# Patient Record
Sex: Female | Born: 1970
Health system: Southern US, Community
[De-identification: ages and names within clinical notes are randomized; demographics above are authoritative.]

## PROBLEM LIST (undated history)

## (undated) DIAGNOSIS — F329 Major depressive disorder, single episode, unspecified: Secondary | ICD-10-CM

## (undated) DIAGNOSIS — C55 Malignant neoplasm of uterus, part unspecified: Secondary | ICD-10-CM

## (undated) DIAGNOSIS — E039 Hypothyroidism, unspecified: Secondary | ICD-10-CM

## (undated) DIAGNOSIS — T7840XA Allergy, unspecified, initial encounter: Secondary | ICD-10-CM

## (undated) DIAGNOSIS — R51 Headache: Secondary | ICD-10-CM

## (undated) DIAGNOSIS — F32A Depression, unspecified: Secondary | ICD-10-CM

## (undated) DIAGNOSIS — R519 Headache, unspecified: Secondary | ICD-10-CM

## (undated) DIAGNOSIS — M1990 Unspecified osteoarthritis, unspecified site: Secondary | ICD-10-CM

## (undated) HISTORY — DX: Allergy, unspecified, initial encounter: T78.40XA

## (undated) HISTORY — DX: Hypothyroidism, unspecified: E03.9

## (undated) HISTORY — DX: Malignant neoplasm of uterus, part unspecified: C55

## (undated) HISTORY — DX: Headache: R51

## (undated) HISTORY — PX: TUBAL LIGATION: SHX77

## (undated) HISTORY — DX: Headache, unspecified: R51.9

## (undated) HISTORY — DX: Depression, unspecified: F32.A

## (undated) HISTORY — DX: Unspecified osteoarthritis, unspecified site: M19.90

## (undated) HISTORY — DX: Major depressive disorder, single episode, unspecified: F32.9

---

## 2012-12-17 DIAGNOSIS — C55 Malignant neoplasm of uterus, part unspecified: Secondary | ICD-10-CM

## 2012-12-17 HISTORY — DX: Malignant neoplasm of uterus, part unspecified: C55

## 2014-12-17 HISTORY — PX: ABDOMINAL HYSTERECTOMY: SHX81

## 2016-09-06 ENCOUNTER — Ambulatory Visit (INDEPENDENT_AMBULATORY_CARE_PROVIDER_SITE_OTHER): Payer: Managed Care, Other (non HMO) | Admitting: Primary Care

## 2016-09-06 ENCOUNTER — Encounter: Payer: Self-pay | Admitting: Primary Care

## 2016-09-06 VITALS — BP 120/78 | HR 86 | Temp 98.3°F | Ht 65.0 in | Wt 164.8 lb

## 2016-09-06 DIAGNOSIS — R51 Headache: Secondary | ICD-10-CM | POA: Diagnosis not present

## 2016-09-06 DIAGNOSIS — E039 Hypothyroidism, unspecified: Secondary | ICD-10-CM | POA: Insufficient documentation

## 2016-09-06 DIAGNOSIS — F419 Anxiety disorder, unspecified: Secondary | ICD-10-CM

## 2016-09-06 DIAGNOSIS — F329 Major depressive disorder, single episode, unspecified: Secondary | ICD-10-CM

## 2016-09-06 DIAGNOSIS — F418 Other specified anxiety disorders: Secondary | ICD-10-CM | POA: Diagnosis not present

## 2016-09-06 DIAGNOSIS — R519 Headache, unspecified: Secondary | ICD-10-CM

## 2016-09-06 LAB — TSH: TSH: 12.24 u[IU]/mL — AB (ref 0.35–4.50)

## 2016-09-06 MED ORDER — BUPROPION HCL ER (XL) 150 MG PO TB24: 150.0000 mg | ORAL_TABLET | Freq: Every day | ORAL | 3 refills | Status: DC

## 2016-09-06 MED ORDER — HYDROXYZINE HCL 25 MG PO TABS: 25.0000 mg | ORAL_TABLET | Freq: Four times a day (QID) | ORAL | 1 refills | Status: DC | PRN

## 2016-09-06 MED ORDER — SERTRALINE HCL 100 MG PO TABS: 100.0000 mg | ORAL_TABLET | Freq: Every day | ORAL | 3 refills | Status: DC

## 2016-09-06 MED ORDER — LEVOTHYROXINE SODIUM 125 MCG PO TABS: 125.0000 ug | ORAL_TABLET | Freq: Every day | ORAL | 0 refills | Status: DC

## 2016-09-06 NOTE — Patient Instructions (Signed)
Complete lab work prior to leaving today. I will notify you of your results once received.   I sent refills of Wellbutrin XL, Zoloft, Hydroxyzine, and levothyroxine to [facility] as requested.  Please schedule a follow up appointment in 6 weeks for re-evaluation of your symptoms.  It was a pleasure to meet you today! Please don't hesitate to call me with any questions. Welcome to [facility]!

## 2016-09-06 NOTE — Progress Notes (Signed)
Subjective:    Patient ID: Theresa Valencia, female    DOB: 01-Apr-1971, 45 y.o.   MRN: [id number]  HPI  Theresa Valencia is a 45 year old female who presents today to establish care and discuss the problems mentioned below. Will obtain old records.  1) Generalized Anxiety Disorder and Depression: Diagnosed 3 years ago. Currently managed on Zoloft 100 mg, Bupropion XL 150 mg, hydroxyzine, and Clonazepam 1 mg.   She moved to the area several months ago and has been rationing her medication as she was nearly out.  Since her move several months ago she's experiening daily panic attacks (2-3 per day), experiencing difficulty at work where she cannot deal with loud noises, has difficulty communicating with her co-workers. She's been crying numerous times daily as she is frustrated and sad since moving. She is completely out of her levothyroxine and Zoloft.   2) Hypothyroidism: Currently managed on levothyroxine 125 mg. She has not been taking her medications as prescribed as she's been rationing her medications since she moved from [place]. She's been out of her thyroid medication for 2 weeks now. Her last TSH above goal 3-4 months ago which was increased to 125 mcg from 100 mcg. She was supposed to follow up with her PCP in [place], but could not get there before she moved. She's experienced mood swings with fatigue.  3) Frequent Headaches: Long history of headaches to right frontal lobe with radiation to right temporal region. Migraines occur every 4-5 months, she will experience photophobia and nausea. She takes tylenol with some improvement. Her last eye exam was 5 years ago.   Review of Systems  Constitutional: Positive for fatigue.  Respiratory: Negative for shortness of breath.   Cardiovascular: Negative for chest pain.  Neurological: Positive for headaches. Negative for dizziness.  Psychiatric/Behavioral: Positive for sleep disturbance. Negative for suicidal ideas. The patient is nervous/anxious.     Depression, tearful, irritable.        Past Medical History:  Diagnosis Date  . Allergy   . Arthritis   . Depression   . Frequent headaches   . Hypothyroidism   . Uterine cancer ([place]) 2014     Social History   Social History  . Marital status: Married    Spouse name: N/A  . Number of children: N/A  . Years of education: N/A   Occupational History  . Not on file.   Social History Main Topics  . Smoking status: Current Every Day Smoker    Packs/day: 0.50    Years: 30.00    Types: Cigarettes  . Smokeless tobacco: Never Used  . Alcohol use No  . Drug use: Unknown  . Sexual activity: Not on file   Other Topics Concern  . Not on file   Social History Narrative   Married.   6 children. 2 grandchildren.   Works for [employer].    Enjoys reading.     Past Surgical History:  Procedure Laterality Date  . ABDOMINAL HYSTERECTOMY  2016  . TUBAL LIGATION      Family History  Problem Relation Age of Onset  . Colon cancer Maternal Grandfather   . Prostate cancer Maternal Grandfather   . Hypertension Maternal Grandfather   . Diabetes Maternal Grandfather     No Known Allergies  No current outpatient prescriptions on file prior to visit.   No current facility-administered medications on file prior to visit.     BP 120/78   Pulse 86   Temp 98.3 F (  36.8 C) (Oral)   Ht 5\' 5"  (1.651 m)   Wt 164 lb 12.8 oz (74.8 kg)   SpO2 97%   BMI 27.42 kg/m    Objective:   Physical Exam  Constitutional: She is oriented to person, place, and time. She appears well-nourished.  Neck: Neck supple. No thyromegaly present.  Cardiovascular: Normal rate and regular rhythm.   Pulmonary/Chest: Effort normal and breath sounds normal.  Neurological: She is alert and oriented to person, place, and time.  Skin: Skin is warm and dry.  Psychiatric:  Tearful during exam intermittently          Assessment & Plan:

## 2016-09-06 NOTE — Progress Notes (Signed)
Pre visit review using our clinic review tool, if applicable. No additional management support is needed unless otherwise documented below in the visit note. 

## 2016-09-06 NOTE — Assessment & Plan Note (Signed)
Daily, mostly right frontal and temporal. Migraines every several months. Continue tylenol for now.

## 2016-09-06 NOTE — Assessment & Plan Note (Signed)
Managed on levothyroxine 125 mcg, recent increase several months ago due to elevations in TSH. Suspect mood swings, depression, fatigue, panice attacks could be related to lack of levothyroxine as she is out. Check TSH today. Restart levothyroxine at 125 mcg. Follow up in 6 weeks.

## 2016-09-06 NOTE — Assessment & Plan Note (Signed)
Out of meds now, has been rationing them by taking less than prescribed. Suspect symptoms today related to a combination of lack of levothyroxine and gradual decrease in prescribed antidepressants. Discussed that I do not prescribe Clonazepam. She understands. Refill provided for Wellbutrin XL, Zoloft, and hydroxyzine. Follow up in 6 weeks for re-evaluation.

## 2016-10-22 ENCOUNTER — Ambulatory Visit (INDEPENDENT_AMBULATORY_CARE_PROVIDER_SITE_OTHER): Payer: Managed Care, Other (non HMO) | Admitting: Primary Care

## 2016-10-22 ENCOUNTER — Encounter: Payer: Self-pay | Admitting: Primary Care

## 2016-10-22 VITALS — BP 118/78 | HR 77 | Temp 97.9°F | Ht 65.0 in | Wt 168.8 lb

## 2016-10-22 DIAGNOSIS — F419 Anxiety disorder, unspecified: Principal | ICD-10-CM

## 2016-10-22 DIAGNOSIS — T148XXA Other injury of unspecified body region, initial encounter: Secondary | ICD-10-CM

## 2016-10-22 DIAGNOSIS — F418 Other specified anxiety disorders: Secondary | ICD-10-CM | POA: Diagnosis not present

## 2016-10-22 DIAGNOSIS — F329 Major depressive disorder, single episode, unspecified: Secondary | ICD-10-CM

## 2016-10-22 DIAGNOSIS — E039 Hypothyroidism, unspecified: Secondary | ICD-10-CM | POA: Diagnosis not present

## 2016-10-22 LAB — CBC
HEMATOCRIT: 38.8 % (ref 36.0–46.0)
HEMOGLOBIN: 13.5 g/dL (ref 12.0–15.0)
MCHC: 34.8 g/dL (ref 30.0–36.0)
MCV: 92.5 fl (ref 78.0–100.0)
Platelets: 261 10*3/uL (ref 150.0–400.0)
RBC: 4.19 Mil/uL (ref 3.87–5.11)
RDW: 13.1 % (ref 11.5–15.5)
WBC: 5.6 10*3/uL (ref 4.0–10.5)

## 2016-10-22 LAB — TSH: TSH: 17.79 u[IU]/mL — ABNORMAL HIGH (ref 0.35–4.50)

## 2016-10-22 NOTE — Progress Notes (Signed)
Subjective:    Patient ID: Theresa Valencia, female    DOB: February 05, 1971, 45 y.o.   MRN: [id number]  HPI  Ms. Theresa Valencia is a 45 year old female who presents today for follow up of anxiety and depression since moving to [place] from [place]. She was evaluated as a new patient 6 weeks ago with complaints of increased symptoms as she had run out of her prior medications for Wellbutrin, Zoloft, and Clonazepam. Last visit she was re-initiated on Zoloft, Wellbutrin. We switched Clonazepam for hydroxyzine.   Since her last visit her mother has passed away and she has recently received a promotion at work. She's feeling much worse overall as she feels like she cannot breathe since the passing of her mother. She wants to shut herself off from the world. She feels that her promotion has caused increased stress as she has more responsibility. She denies SI/HI. She is sleeping well.  2) Hypothyroidism: TSH of 12 during last visit as she's had previously been out of her levothyroxine. She is due for recheck today. She is currently managed on Levothyroxine 125 mcg. She has noticed numerous bruises that have become frequent to her bilateral lower extremities without recollection of recent injury.  Review of Systems  Constitutional: Positive for fatigue.  Respiratory: Positive for shortness of breath.   Cardiovascular: Positive for palpitations. Negative for chest pain.  Psychiatric/Behavioral: Negative for sleep disturbance and suicidal ideas. The patient is nervous/anxious.        See HPI       Past Medical History:  Diagnosis Date  . Allergy   . Arthritis   . Depression   . Frequent headaches   . Hypothyroidism   . Uterine cancer ([place]) 2014     Social History   Social History  . Marital status: Married    Spouse name: N/A  . Number of children: N/A  . Years of education: N/A   Occupational History  . Not on file.   Social History Main Topics  . Smoking status: Current Every Day Smoker   Packs/day: 0.50    Years: 30.00    Types: Cigarettes  . Smokeless tobacco: Never Used  . Alcohol use No  . Drug use: Unknown  . Sexual activity: Not on file   Other Topics Concern  . Not on file   Social History Narrative   Married.   6 children. 2 grandchildren.   Works for [employer].    Enjoys reading.     Past Surgical History:  Procedure Laterality Date  . ABDOMINAL HYSTERECTOMY  2016  . TUBAL LIGATION      Family History  Problem Relation Age of Onset  . Colon cancer Maternal Grandfather   . Prostate cancer Maternal Grandfather   . Hypertension Maternal Grandfather   . Diabetes Maternal Grandfather     No Known Allergies  Current Outpatient Prescriptions on File Prior to Visit  Medication Sig Dispense Refill  . buPROPion (WELLBUTRIN XL) 150 MG 24 hr tablet Take 1 tablet (150 mg total) by mouth daily. 90 tablet 3  . hydrOXYzine (ATARAX/VISTARIL) 25 MG tablet Take 1 tablet (25 mg total) by mouth every 6 (six) hours as needed for anxiety. 90 tablet 1  . levothyroxine (SYNTHROID, LEVOTHROID) 125 MCG tablet Take 1 tablet (125 mcg total) by mouth daily before breakfast. 30 tablet 0  . sertraline (ZOLOFT) 100 MG tablet Take 1 tablet (100 mg total) by mouth daily. 90 tablet 3   No current facility-administered medications on  file prior to visit.     BP 118/78   Pulse 77   Temp 97.9 F (36.6 C) (Oral)   Ht 5\' 5"  (1.651 m)   Wt 168 lb 12.8 oz (76.6 kg)   SpO2 97%   BMI 28.09 kg/m    Objective:   Physical Exam  Constitutional: She appears well-nourished.  Cardiovascular: Normal rate and regular rhythm.   Pulmonary/Chest: Effort normal and breath sounds normal.  Skin: Skin is warm and dry.  Mild bruising to bilateral lower extremities.  Psychiatric:  Tearful during exam          Assessment & Plan:

## 2016-10-22 NOTE — Progress Notes (Signed)
Pre visit review using our clinic review tool, if applicable. No additional management support is needed unless otherwise documented below in the visit note. 

## 2016-10-22 NOTE — Assessment & Plan Note (Signed)
Has lost her mother and received new promotion since last visit. Feels worse. Compliant to medications. Tearful during exam, denies SI/HI. Urgent referral sent for therapy as she would greatly benefit.

## 2016-10-22 NOTE — Patient Instructions (Signed)
Complete lab work prior to leaving today. I will notify you of your results once received.   Stop by the front desk and speak with either Rosaria Ferries or Ebony Hail regarding your referral to therapy.   Congratulations on your promotion! You deserve it!  It was a pleasure to see you today!

## 2016-10-22 NOTE — Assessment & Plan Note (Addendum)
TSH of 12 on previous labs, due for recheck today since re-initiation of levothyroxine 125 mcg. Will either continue current dose or adjust accordingly. Will check CBC for bruising.

## 2016-10-23 ENCOUNTER — Telehealth: Payer: Self-pay | Admitting: Primary Care

## 2016-10-23 ENCOUNTER — Other Ambulatory Visit: Payer: Self-pay | Admitting: Primary Care

## 2016-10-23 DIAGNOSIS — E039 Hypothyroidism, unspecified: Secondary | ICD-10-CM

## 2016-10-23 MED ORDER — LEVOTHYROXINE SODIUM 150 MCG PO TABS: 150.0000 ug | ORAL_TABLET | Freq: Every day | ORAL | 0 refills | Status: DC

## 2016-10-23 NOTE — Telephone Encounter (Signed)
Spoken and notified patient of Kate's comments. Patient verbalized understanding. 

## 2016-10-23 NOTE — Telephone Encounter (Signed)
Patient returned Chan's call.  Patient said she'll clear out her voice mail, so Vallarie Mare can leave a detailed message on her voice mail.

## 2016-11-14 ENCOUNTER — Ambulatory Visit: Payer: Managed Care, Other (non HMO) | Admitting: Psychology

## 2016-11-15 ENCOUNTER — Other Ambulatory Visit: Payer: Self-pay | Admitting: Primary Care

## 2016-11-15 DIAGNOSIS — E039 Hypothyroidism, unspecified: Secondary | ICD-10-CM

## 2016-11-21 ENCOUNTER — Other Ambulatory Visit: Payer: Managed Care, Other (non HMO)

## 2016-11-30 ENCOUNTER — Encounter: Payer: Self-pay | Admitting: Emergency Medicine

## 2016-11-30 ENCOUNTER — Emergency Department: Payer: Managed Care, Other (non HMO)

## 2016-11-30 DIAGNOSIS — E039 Hypothyroidism, unspecified: Secondary | ICD-10-CM | POA: Diagnosis not present

## 2016-11-30 DIAGNOSIS — Z8541 Personal history of malignant neoplasm of cervix uteri: Secondary | ICD-10-CM | POA: Diagnosis not present

## 2016-11-30 DIAGNOSIS — T50995A Adverse effect of other drugs, medicaments and biological substances, initial encounter: Secondary | ICD-10-CM | POA: Insufficient documentation

## 2016-11-30 DIAGNOSIS — Z79899 Other long term (current) drug therapy: Secondary | ICD-10-CM | POA: Insufficient documentation

## 2016-11-30 DIAGNOSIS — Y69 Unspecified misadventure during surgical and medical care: Secondary | ICD-10-CM | POA: Insufficient documentation

## 2016-11-30 DIAGNOSIS — T887XXA Unspecified adverse effect of drug or medicament, initial encounter: Secondary | ICD-10-CM | POA: Diagnosis present

## 2016-11-30 DIAGNOSIS — F17210 Nicotine dependence, cigarettes, uncomplicated: Secondary | ICD-10-CM | POA: Diagnosis not present

## 2016-11-30 MED ORDER — ONDANSETRON 4 MG PO TBDP
ORAL_TABLET | ORAL | Status: AC
  Filled 2016-11-30: qty 1

## 2016-11-30 MED ORDER — ONDANSETRON 4 MG PO TBDP
4.0000 mg | ORAL_TABLET | Freq: Once | ORAL | Status: AC | PRN
  Administered 2016-11-30: 4 mg via ORAL

## 2016-11-30 NOTE — ED Triage Notes (Addendum)
Pt to desk via [place], reports recently increased her synthroid, now having headache, blurred vision, and n/v.  Pt tearful at stat desk

## 2016-12-01 ENCOUNTER — Emergency Department
Admission: EM | Admit: 2016-12-01 | Discharge: 2016-12-01 | Disposition: A | Discharge status: Home / Self Care | Payer: Managed Care, Other (non HMO) | Attending: Emergency Medicine | Admitting: Emergency Medicine

## 2016-12-01 DIAGNOSIS — T50905A Adverse effect of unspecified drugs, medicaments and biological substances, initial encounter: Secondary | ICD-10-CM

## 2016-12-01 MED ORDER — ONDANSETRON 4 MG PO TBDP: 4.0000 mg | ORAL_TABLET | Freq: Three times a day (TID) | ORAL | 0 refills | Status: DC | PRN

## 2016-12-01 NOTE — Discharge Instructions (Signed)
1. Resume your old dosage of Synthroid. You may now take 1/2 tablet daily. 2. You may take nausea medicine as needed (Zofran #20). 3. Return to the ER for worsening symptoms, persistent vomiting, difficulty breathing or other concerns.

## 2016-12-01 NOTE — ED Provider Notes (Signed)
[facility] Emergency Department Provider Note   ____________________________________________   First MD Initiated Contact with Patient 12/01/16 0118     (approximate)  I have reviewed the triage vital signs and the nursing notes.   HISTORY  Chief Complaint Medication Reaction; Headache; and Emesis    HPI Theresa Valencia is a 45 y.o. female who presents to the ED from home with a chief complaint of medication reaction. Patient has a history of hypothyroidism on Synthroid. Reports her PCP recently doubled her dosage. She took her new dosage yesterday afternoon and began having headache with associated nausea and vomiting as well as blurred vision. Patient reports similar symptoms previously with each increase in Synthroid dosage; however, her reaction this time was the most intense and made her tearful. Denies associated neck pain, chest pain, shortness of breath, dysuria, diarrhea. Denies recent travel or trauma. Nothing made her symptoms worse. Patient received an ODT Zofran while awaiting treatment room and all symptoms have resolved.   Past Medical History:  Diagnosis Date  . Allergy   . Arthritis   . Depression   . Frequent headaches   . Hypothyroidism   . Uterine cancer [facility]) 2014    Patient Active Problem List   Diagnosis Date Noted  . Hypothyroidism 09/06/2016  . Anxiety and depression 09/06/2016  . Frequent headaches 09/06/2016    Past Surgical History:  Procedure Laterality Date  . ABDOMINAL HYSTERECTOMY  2016  . TUBAL LIGATION      Prior to Admission medications   Medication Sig Start Date End Date Taking? Authorizing Provider  buPROPion (WELLBUTRIN XL) 150 MG 24 hr tablet Take 1 tablet (150 mg total) by mouth daily. 09/06/16   Pleas Koch, NP  hydrOXYzine (ATARAX/VISTARIL) 25 MG tablet Take 1 tablet (25 mg total) by mouth every 6 (six) hours as needed for anxiety. 09/06/16   Pleas Koch, NP  levothyroxine (SYNTHROID) 150 MCG  tablet Take 1 tablet (150 mcg total) by mouth daily before breakfast. 10/23/16   Pleas Koch, NP  ondansetron (ZOFRAN ODT) 4 MG disintegrating tablet Take 1 tablet (4 mg total) by mouth every 8 (eight) hours as needed for nausea or vomiting. 12/01/16   Paulette Blanch, MD  sertraline (ZOLOFT) 100 MG tablet Take 1 tablet (100 mg total) by mouth daily. 09/06/16   Pleas Koch, NP    Allergies Patient has no known allergies.  Family History  Problem Relation Age of Onset  . Colon cancer Maternal Grandfather   . Prostate cancer Maternal Grandfather   . Hypertension Maternal Grandfather   . Diabetes Maternal Grandfather     Social History Social History  Substance Use Topics  . Smoking status: Current Every Day Smoker    Packs/day: 0.50    Years: 30.00    Types: Cigarettes  . Smokeless tobacco: Never Used  . Alcohol use No    Review of Systems  Constitutional: No fever/chills. Eyes: Positive for blurry vision.. ENT: No sore throat. Cardiovascular: Denies chest pain. Respiratory: Denies shortness of breath. Gastrointestinal: No abdominal pain.  Positive for nausea and vomiting.  No diarrhea.  No constipation. Genitourinary: Negative for dysuria. Musculoskeletal: Negative for back pain. Skin: Negative for rash. Neurological: Positive for headache. Negative for focal weakness or numbness.  10-point ROS otherwise negative.  ____________________________________________   PHYSICAL EXAM:  VITAL SIGNS: ED Triage Vitals [11/30/16 2238]  Enc Vitals Group     BP 123/86     Pulse Rate 89  Resp 20     Temp 98 F (36.7 C)     Temp Source Oral     SpO2 97 %     Weight 160 lb (72.6 kg)     Height 5\' 4"  (1.626 m)     Head Circumference      Peak Flow      Pain Score 7     Pain Loc      Pain Edu?      Excl. in [place]?     Constitutional: Alert and oriented. Well appearing and in no acute distress. Eyes: Conjunctivae are normal. PERRL. EOMI. Head: Atraumatic. Nose:  No congestion/rhinnorhea. Mouth/Throat: Mucous membranes are moist.  Oropharynx non-erythematous. Neck: No stridor.  Supple neck without meningismus. Cardiovascular: Normal rate, regular rhythm. Grossly normal heart sounds.  Good peripheral circulation. Respiratory: Normal respiratory effort.  No retractions. Lungs CTAB. Gastrointestinal: Soft and nontender. No distention. No abdominal bruits. No CVA tenderness. Musculoskeletal: No lower extremity tenderness nor edema.  No joint effusions. Neurologic:  Normal speech and language. No gross focal neurologic deficits are appreciated. No gait instability. Skin:  Skin is warm, dry and intact. No rash noted. Psychiatric: Mood and affect are normal. Speech and behavior are normal.  ____________________________________________   LABS (all labs ordered are listed, but only abnormal results are displayed)  Labs Reviewed - No data to display ____________________________________________  EKG  None ____________________________________________  RADIOLOGY  CT head without contrast interpreted per Dr. Quintella Reichert: No acute intracranial pathology. ____________________________________________   PROCEDURES  Procedure(s) performed: None  Procedures  Critical Care performed: No  ____________________________________________   INITIAL IMPRESSION / ASSESSMENT AND PLAN / ED COURSE  Pertinent labs & imaging results that were available during my care of the patient were reviewed by me and considered in my medical decision making (see chart for details).  46 year old female who presents with medication reaction secondary to doubling Synthroid dosage. She is currently asymptomatic without focal neurological deficits after ODT Zofran. We discussed at length and I recommended she go back to her old dose and see her PCP next week. Will also give her the number to our local endocrinologist for specialty follow-up. Strict return precautions given. Patient  verbalizes understanding and agrees with plan of care.  Clinical Course      ____________________________________________   FINAL CLINICAL IMPRESSION(S) / ED DIAGNOSES  Final diagnoses:  Medication reaction, initial encounter      NEW MEDICATIONS STARTED DURING THIS VISIT:  Discharge Medication List as of 12/01/2016  1:36 AM    START taking these medications   Details  ondansetron (ZOFRAN ODT) 4 MG disintegrating tablet Take 1 tablet (4 mg total) by mouth every 8 (eight) hours as needed for nausea or vomiting., Starting Sat 12/01/2016, Print         Note:  This document was prepared using Dragon voice recognition software and may include unintentional dictation errors.    Paulette Blanch, MD 12/01/16 [phone]

## 2016-12-01 NOTE — ED Notes (Signed)
Pt states she went up on her dose of synthroid. Pt states she started throwing up so much it gave her a headache and blurred vision. Pt states she is feeling much better now, feels HA going away. Pt is alert and oriented x4, moving around on bed. Pt states she wants to go home now. Family raising voice at pt and this RN stating that she needs to take the medicine when pt states she won't take it anymore.

## 2016-12-12 ENCOUNTER — Telehealth: Payer: Self-pay | Admitting: Primary Care

## 2016-12-12 NOTE — Telephone Encounter (Signed)
Please call patient:  I saw that she went to the ED on 12/15 for a reaction after we increased her Synthroid. 1. Please ensure she understands that we did NOT double her medication, we increased her from 125 mcg to 150 mcg. She should be taking only 150 mcg, not anything doubled.  2. How much is she taking now? 3. How's she feeling now?

## 2016-12-12 NOTE — Telephone Encounter (Signed)
Message left for patient to return my call.  

## 2016-12-19 NOTE — Telephone Encounter (Signed)
Spoken and notified patient of Kate's comments.   Patient stated that since the ED visit she has been taking 1/2 tablet of the 150 mcg.  Patient feels bad. She has been very tired, dizzy at times, blurred vision but cannot afford to pay the $20 co-pay to come to the office. 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

## 2016-12-19 NOTE — Telephone Encounter (Signed)
She needs a repeat TSH, can she come in for a lab only appointment for recheck? If so, please schedule.

## 2016-12-19 NOTE — Telephone Encounter (Signed)
Message left for patient to return my call.  

## 2016-12-20 ENCOUNTER — Other Ambulatory Visit (INDEPENDENT_AMBULATORY_CARE_PROVIDER_SITE_OTHER): Payer: Managed Care, Other (non HMO)

## 2016-12-20 DIAGNOSIS — E039 Hypothyroidism, unspecified: Secondary | ICD-10-CM | POA: Diagnosis not present

## 2016-12-20 LAB — TSH: TSH: 1.97 u[IU]/mL (ref 0.35–4.50)

## 2016-12-20 NOTE — Telephone Encounter (Signed)
Spoken to patient and the lab appt on 12/20/2016.  Per patient to leave a detail message of lab results.

## 2017-02-05 ENCOUNTER — Other Ambulatory Visit: Payer: Self-pay | Admitting: Primary Care

## 2017-02-05 DIAGNOSIS — E039 Hypothyroidism, unspecified: Secondary | ICD-10-CM

## 2017-08-13 ENCOUNTER — Ambulatory Visit (INDEPENDENT_AMBULATORY_CARE_PROVIDER_SITE_OTHER): Payer: Managed Care, Other (non HMO) | Admitting: Primary Care

## 2017-08-13 ENCOUNTER — Encounter: Payer: Self-pay | Admitting: Primary Care

## 2017-08-13 VITALS — BP 110/76 | HR 78 | Temp 98.0°F | Ht 65.0 in | Wt 147.0 lb

## 2017-08-13 DIAGNOSIS — M545 Low back pain, unspecified: Secondary | ICD-10-CM | POA: Insufficient documentation

## 2017-08-13 DIAGNOSIS — F419 Anxiety disorder, unspecified: Secondary | ICD-10-CM | POA: Diagnosis not present

## 2017-08-13 DIAGNOSIS — F329 Major depressive disorder, single episode, unspecified: Secondary | ICD-10-CM | POA: Diagnosis not present

## 2017-08-13 DIAGNOSIS — E039 Hypothyroidism, unspecified: Secondary | ICD-10-CM | POA: Diagnosis not present

## 2017-08-13 DIAGNOSIS — G8929 Other chronic pain: Secondary | ICD-10-CM | POA: Insufficient documentation
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
## 2017-08-13 NOTE — Assessment & Plan Note (Signed)
Improved since switching jobs and since her mother as passed. Will continue to monitor off medications.

## 2017-08-13 NOTE — Patient Instructions (Signed)
Schedule a lab and xray only appointment later this week at your convenience.  I will be in touch with your results once received. I will then notify you of the best treatment for pain at that time.  It was a pleasure to see you today!

## 2017-08-13 NOTE — Assessment & Plan Note (Addendum)
Check TSH today given history of unstable readings. Continue levothyroxine 150 mcg for now.

## 2017-08-15 ENCOUNTER — Other Ambulatory Visit: Payer: Self-pay | Admitting: Primary Care

## 2017-08-15 ENCOUNTER — Ambulatory Visit (INDEPENDENT_AMBULATORY_CARE_PROVIDER_SITE_OTHER)
Admission: RE | Admit: 2017-08-15 | Discharge: 2017-08-15 | Disposition: A | Discharge status: Home / Self Care | Payer: Managed Care, Other (non HMO) | Source: Ambulatory Visit | Attending: Primary Care | Admitting: Primary Care

## 2017-08-15 ENCOUNTER — Other Ambulatory Visit (INDEPENDENT_AMBULATORY_CARE_PROVIDER_SITE_OTHER): Payer: Managed Care, Other (non HMO)

## 2017-08-15 DIAGNOSIS — E039 Hypothyroidism, unspecified: Secondary | ICD-10-CM

## 2017-08-15 DIAGNOSIS — M545 Low back pain, unspecified: Secondary | ICD-10-CM

## 2017-08-15 DIAGNOSIS — G8929 Other chronic pain: Secondary | ICD-10-CM | POA: Diagnosis not present

## 2017-08-15 LAB — COMPREHENSIVE METABOLIC PANEL
ALBUMIN: 4 g/dL (ref 3.5–5.2)
ALT: 19 U/L (ref 0–35)
AST: 20 U/L (ref 0–37)
Alkaline Phosphatase: 62 U/L (ref 39–117)
BUN: 13 mg/dL (ref 6–23)
CALCIUM: 9.7 mg/dL (ref 8.4–10.5)
CHLORIDE: 109 meq/L (ref 96–112)
CO2: 29 meq/L (ref 19–32)
Creatinine, Ser: 0.84 mg/dL (ref 0.40–1.20)
GFR: 78.06 mL/min (ref >60.00)
Glucose, Bld: 80 mg/dL (ref 70–99)
Potassium: 4.3 mEq/L (ref 3.5–5.1)
Sodium: 143 mEq/L (ref 135–145)
Total Bilirubin: 0.6 mg/dL (ref 0.2–1.2)
Total Protein: 6.5 g/dL (ref 6.0–8.3)

## 2017-08-15 LAB — TSH: TSH: 0.08 u[IU]/mL — ABNORMAL LOW (ref 0.35–4.50)

## 2017-08-15 MED ORDER — DICLOFENAC SODIUM 75 MG PO TBEC: 75.0000 mg | DELAYED_RELEASE_TABLET | Freq: Two times a day (BID) | ORAL | 0 refills | Status: DC

## 2017-08-15 MED ORDER — LEVOTHYROXINE SODIUM 137 MCG PO TABS: 137.0000 ug | ORAL_TABLET | Freq: Every day | ORAL | 0 refills | Status: DC

## 2017-08-16 ENCOUNTER — Encounter: Payer: Self-pay | Admitting: *Deleted

## 2017-08-16 ENCOUNTER — Other Ambulatory Visit: Payer: Self-pay | Admitting: Primary Care

## 2017-08-16 DIAGNOSIS — G8929 Other chronic pain: Secondary | ICD-10-CM

## 2017-08-16 DIAGNOSIS — M545 Low back pain: Principal | ICD-10-CM

## 2017-08-23 ENCOUNTER — Telehealth: Payer: Self-pay | Admitting: Primary Care

## 2017-08-23 NOTE — Telephone Encounter (Signed)
Left message asking pt to call office regarding her PT referral.  Does she want [place]/am/pm

## 2017-09-16 ENCOUNTER — Other Ambulatory Visit: Payer: Self-pay | Admitting: Primary Care

## 2017-09-16 DIAGNOSIS — M545 Low back pain: Principal | ICD-10-CM

## 2017-09-16 DIAGNOSIS — G8929 Other chronic pain: Secondary | ICD-10-CM

## 2017-09-16 NOTE — Telephone Encounter (Signed)
Does she need a refill of the diclofenac? How often is she taking this? Has she started PT?

## 2017-09-16 NOTE — Telephone Encounter (Signed)
Ok to refill? Electronically refill request for diclofenac (VOLTAREN) 75 MG EC tablet  Last prescribed on 08/15/2017. Last seen on 08/13/2017

## 2017-09-19 NOTE — Telephone Encounter (Signed)
Tried to call patient on 09/18/2017 but voicemail is different before. Will try again

## 2017-09-25 NOTE — Telephone Encounter (Signed)
Noted, refills sent to pharmacy. 

## 2017-09-25 NOTE — Telephone Encounter (Signed)
Patient does want a refill on diclofenac.  Patient is taking it 2 x a day as prescribed.  Patient hasn't gone to PT yet. Her mother-in-law was in [place] and then passed away in [place] and she's been in [place].  Patient just got back last night from [place], but patient will try to call PT and schedule an appointment. It depends on her work schedule. Patient can be reached at  [phone].  If patient doesn't answer, a detailed message can be left on her voice mail.

## 2017-11-15 ENCOUNTER — Other Ambulatory Visit: Payer: Self-pay | Admitting: Primary Care

## 2017-11-15 DIAGNOSIS — E039 Hypothyroidism, unspecified: Secondary | ICD-10-CM

## 2017-11-15 NOTE — Telephone Encounter (Signed)
Ok to refill? Electronically refill request for levothyroxine (SYNTHROID, LEVOTHROID) 137 MCG tablet  Last prescribed on 08/15/2017. Last seen on 08/13/2017

## 2017-11-16 NOTE — Telephone Encounter (Signed)
Patient needs repeat TSH, please schedule. Will send 30 day supply of levothyroxine until we can verify her TSH level.

## 2017-11-20 NOTE — Telephone Encounter (Signed)
Message left for patient to return my call.  

## 2017-11-22 ENCOUNTER — Other Ambulatory Visit: Payer: Self-pay | Admitting: Primary Care

## 2017-11-22 ENCOUNTER — Other Ambulatory Visit (INDEPENDENT_AMBULATORY_CARE_PROVIDER_SITE_OTHER): Payer: Managed Care, Other (non HMO)

## 2017-11-22 ENCOUNTER — Other Ambulatory Visit: Payer: Managed Care, Other (non HMO)

## 2017-11-22 DIAGNOSIS — E039 Hypothyroidism, unspecified: Secondary | ICD-10-CM | POA: Diagnosis not present

## 2017-11-22 LAB — TSH: TSH: 0.15 u[IU]/mL — AB (ref 0.35–4.50)

## 2017-11-22 MED ORDER — LEVOTHYROXINE SODIUM 125 MCG PO TABS: ORAL_TABLET | ORAL | 1 refills | Status: DC

## 2017-11-28 NOTE — Telephone Encounter (Signed)
Patient already came in for lab on 11/22/2017.

## 2017-12-02 ENCOUNTER — Encounter: Payer: Self-pay | Admitting: *Deleted

## 2018-01-13 ENCOUNTER — Ambulatory Visit: Payer: Self-pay | Admitting: *Deleted

## 2018-01-13 NOTE — Telephone Encounter (Signed)
Pt called stating that 6 months ago voice started going out; at urgent care today and during exam MD told pt that her thyroid was enlarged; Also at that appointment she was diagnosed with bronchitis and given prescriptions per urgent care MD; she further states that she has been feeling like crap: tired, no energy; pain in neck, throat and muscles on a daily basis; per nurse triage protocol recommendation made for pt to see physician within 24 hours; pt offered and accepted appointment with Allie Bossier 01/14/18 at 1430; pt verbalizes understanding. Reason for Disposition . SEVERE sore throat pain  Answer Assessment - Initial Assessment Questions 1. DESCRIPTION: "Describe your voice."     hoarse 2. ONSET: "When did the hoarseness begin?"     6 months ago 3. COUGH: "Is there a cough?" If so, ask: "How bad?"     pt recently diagnosed with bronchitits 4. FEVER: "Do you have a fever?" If so, ask: "What is your temperature, how was it measured, and when did it start?"     no 5. RESPIRATORY STATUS: "Describe your breathing."      Hard to catch breath sometimes 6. ALLERGIES: "Any allergy symptoms?" If so, ask: "What are they?"     no 7. IRRITANTS: "Do you smoke?" "Have you been exposed to any irritating fumes?"     Yes; decreased to 2 cigarettes per day 8. CAUSE: "What do you think is causing the hoarseness?"     thyroid 9. OTHER SYMPTOMS: "Do you have any other symptoms?" (e.g., sore throat, swelling, foreign body, rash)     Thyroid swelling 10. PREGNANCY: "Is there any chance you are pregnant?" "When was your last menstrual period?"       No hysterectomy  Protocols used: HOARSENESS-A-AH

## 2018-01-14 ENCOUNTER — Ambulatory Visit: Payer: BLUE CROSS/BLUE SHIELD | Admitting: Primary Care

## 2018-01-14 ENCOUNTER — Encounter: Payer: Self-pay | Admitting: Primary Care

## 2018-01-14 VITALS — BP 110/74 | HR 89 | Temp 98.4°F | Ht 65.0 in | Wt 136.8 lb

## 2018-01-14 DIAGNOSIS — E039 Hypothyroidism, unspecified: Secondary | ICD-10-CM | POA: Diagnosis not present

## 2018-01-14 NOTE — Progress Notes (Signed)
Subjective:    Patient ID: Theresa Valencia, female    DOB: 05-08-1971, 47 y.o.   MRN: [id number]  HPI  Theresa Valencia is a 47 year old female with a history of hypothyroidism who presents today with a chief complaint of possible thyroid nodule.   She is currently managed on levothyroxine 125 mcg which was reduced in December 2018 due to a TSH level of 0.15. She is compliant to her levothyroxine at 125 mcg dose.   She presented to Urgent Care on 01/13/18 with complaints of nasal congestion, headaches, voice hoarseness. She endorsed intermittent voice hoarseness for years. The MD examined her neck and told her that he felt "something" on the right anterior neck. He told her to see her PCP.   She is very worried about this "something" that was found on her anterior throat. She denies feeling anything herself. Her symptoms feel more like a "cold" than anything.   Review of Systems  Constitutional: Negative for fever.  HENT: Positive for congestion and sore throat.   Respiratory: Positive for cough. Negative for shortness of breath.   Neurological: Positive for headaches.       Past Medical History:  Diagnosis Date  . Allergy   . Arthritis   . Depression   . Frequent headaches   . Hypothyroidism   . Uterine cancer ([place]) 2014     Social History   Socioeconomic History  . Marital status: Married    Spouse name: Not on file  . Number of children: Not on file  . Years of education: Not on file  . Highest education level: Not on file  Social Needs  . Financial resource strain: Not on file  . Food insecurity - worry: Not on file  . Food insecurity - inability: Not on file  . Transportation needs - medical: Not on file  . Transportation needs - non-medical: Not on file  Occupational History  . Not on file  Tobacco Use  . Smoking status: Current Every Day Smoker    Packs/day: 0.50    Years: 30.00    Pack years: 15.00    Types: Cigarettes  . Smokeless tobacco: Never Used  Substance  and Sexual Activity  . Alcohol use: No  . Drug use: Not on file  . Sexual activity: Not on file  Other Topics Concern  . Not on file  Social History Narrative   Married.   6 children. 2 grandchildren.   Works for [employer].    Enjoys reading.     Past Surgical History:  Procedure Laterality Date  . ABDOMINAL HYSTERECTOMY  2016  . TUBAL LIGATION      Family History  Problem Relation Age of Onset  . Colon cancer Maternal Grandfather   . Prostate cancer Maternal Grandfather   . Hypertension Maternal Grandfather   . Diabetes Maternal Grandfather     No Known Allergies  Current Outpatient Medications on File Prior to Visit  Medication Sig Dispense Refill  . levothyroxine (SYNTHROID) 125 MCG tablet Take one tablet by mouth every morning on an empty stomach with a full glass of water. 30 tablet 1  . diclofenac (VOLTAREN) 75 MG EC tablet TAKE 1 TABLET(75 MG) BY MOUTH TWICE DAILY (Patient not taking: Reported on 01/14/2018) 60 tablet 1   No current facility-administered medications on file prior to visit.     BP 110/74   Pulse 89   Temp 98.4 F (36.9 C) (Oral)   Ht 5\' 5"  (1.651 m)  Wt 136 lb 12.8 oz (62.1 kg)   SpO2 99%   BMI 22.76 kg/m    Objective:   Physical Exam  Constitutional: She appears well-nourished.  HENT:  Right Ear: Tympanic membrane and ear canal normal.  Left Ear: Tympanic membrane and ear canal normal.  Nose: Right sinus exhibits no maxillary sinus tenderness and no frontal sinus tenderness. Left sinus exhibits no maxillary sinus tenderness and no frontal sinus tenderness.  Mouth/Throat: Oropharynx is clear and moist.  Voice hoarseness   Eyes: Conjunctivae are normal.  Neck: Neck supple. No thyromegaly present.  Cardiovascular: Normal rate and regular rhythm.  Pulmonary/Chest: Effort normal and breath sounds normal. She has no wheezes. She has no rales.  Lymphadenopathy:    She has no cervical adenopathy.  Skin: Skin is warm and dry.           Assessment & Plan:

## 2018-01-14 NOTE — Patient Instructions (Signed)
Stop by the lab prior to leaving today. I will notify you of your results once received.   You will be contacted regarding your ultrasound.  Please let us know if you have not been contacted within one week.   It was a pleasure to see you today!    

## 2018-01-14 NOTE — Assessment & Plan Note (Signed)
Due for repeat TSH level, pending today. Compliant to levothyroxine at 125 mcg dose, will adjust accordingly if needed.  No obvious nodules noted on exam. Suspect she could have had cervical lymphadenopathy from URI symptoms but will rule out nodule.   [place] of thyroid gland pending

## 2018-01-15 ENCOUNTER — Other Ambulatory Visit: Payer: Self-pay | Admitting: *Deleted

## 2018-01-15 DIAGNOSIS — E039 Hypothyroidism, unspecified: Secondary | ICD-10-CM

## 2018-01-15 LAB — TSH: TSH: 2.05 u[IU]/mL (ref 0.35–4.50)

## 2018-01-15 LAB — T4, FREE: FREE T4: 1.06 ng/dL (ref 0.60–1.60)

## 2018-01-15 MED ORDER — LEVOTHYROXINE SODIUM 125 MCG PO TABS: ORAL_TABLET | ORAL | 0 refills | Status: DC

## 2018-01-21 ENCOUNTER — Ambulatory Visit
Admission: RE | Admit: 2018-01-21 | Discharge: 2018-01-21 | Disposition: A | Discharge status: Home / Self Care | Payer: BLUE CROSS/BLUE SHIELD | Source: Ambulatory Visit | Attending: Primary Care | Admitting: Primary Care

## 2018-01-21 DIAGNOSIS — E039 Hypothyroidism, unspecified: Secondary | ICD-10-CM

## 2018-04-12 ENCOUNTER — Other Ambulatory Visit: Payer: Self-pay | Admitting: Primary Care

## 2018-04-12 DIAGNOSIS — E039 Hypothyroidism, unspecified: Secondary | ICD-10-CM

## 2018-06-16 ENCOUNTER — Ambulatory Visit (INDEPENDENT_AMBULATORY_CARE_PROVIDER_SITE_OTHER)
Admission: RE | Admit: 2018-06-16 | Discharge: 2018-06-16 | Disposition: A | Discharge status: Home / Self Care | Payer: BLUE CROSS/BLUE SHIELD | Source: Ambulatory Visit | Attending: Family Medicine | Admitting: Family Medicine

## 2018-06-16 ENCOUNTER — Ambulatory Visit: Payer: BLUE CROSS/BLUE SHIELD | Admitting: Family Medicine

## 2018-06-16 ENCOUNTER — Encounter: Payer: Self-pay | Admitting: Family Medicine

## 2018-06-16 VITALS — BP 110/70 | HR 68 | Temp 98.4°F | Ht 65.0 in | Wt 126.5 lb

## 2018-06-16 DIAGNOSIS — R0789 Other chest pain: Secondary | ICD-10-CM | POA: Diagnosis not present

## 2018-06-16 MED ORDER — TRAMADOL HCL 50 MG PO TABS: 50.0000 mg | ORAL_TABLET | Freq: Three times a day (TID) | ORAL | 0 refills | Status: AC | PRN

## 2018-06-16 MED ORDER — CYCLOBENZAPRINE HCL 10 MG PO TABS: 5.0000 mg | ORAL_TABLET | Freq: Three times a day (TID) | ORAL | 0 refills | Status: DC | PRN

## 2018-06-16 NOTE — Patient Instructions (Signed)
We'll contact you with your xray report. Try flexeril for pain.  Try using ice.   If still with a lot of pain then try tramadol.  Sedation caution on the meds.  Update [place] as needed.   Take care.  Glad to see you.

## 2018-06-16 NOTE — Progress Notes (Signed)
L sided thorax pain, under the L ribs.  "Like a stitch" on the L side.  She had to leave work last night.  Going on for a few days.  Not SOB but had trouble taking a deep breath from the pain- but still able to get a deep breath.  Pain continues today.  No rash on thorax.  Not hypersensitive to light touch but her bra strap hurts it with compression.  Pain doesn't feel superficial.  No R sided sx.  No trauma, no trigger.  No bruising.  No assault, etc.  No FCNAV.  No sputum.  No cough.    Meds, vitals, and allergies reviewed.   ROS: Per HPI unless specifically indicated in ROS section   GEN: nad, alert and oriented HEENT: mucous membranes moist NECK: supple w/o LA CV: rrr.  PULM: ctab, no inc wob, L chest wall ttp w/o rash or bruise on the L side of thorax at the anterior inferior ribs ABD: soft, +bs, not ttp EXT: no edema SKIN: no acute rash, well perfused.

## 2018-06-17 DIAGNOSIS — R0789 Other chest pain: Secondary | ICD-10-CM | POA: Insufficient documentation

## 2018-06-17 NOTE — Assessment & Plan Note (Signed)
Likely benign chest wall issue, is muscle strain.   See notes on imaging.  Okay for outpatient f/u.  Try flexeril for pain.  Try using ice.   If still with a lot of pain then try tramadol.  Sedation caution on the meds.  Update [place] as needed.   She agrees.  Ddx d/w pt.  No sign of ominous process.

## 2018-07-07 ENCOUNTER — Other Ambulatory Visit: Payer: Self-pay | Admitting: Primary Care

## 2018-07-07 DIAGNOSIS — E039 Hypothyroidism, unspecified: Secondary | ICD-10-CM

## 2018-09-16 ENCOUNTER — Other Ambulatory Visit: Payer: Self-pay | Admitting: Primary Care

## 2018-09-16 DIAGNOSIS — E039 Hypothyroidism, unspecified: Secondary | ICD-10-CM

## 2018-10-16 ENCOUNTER — Ambulatory Visit: Payer: BLUE CROSS/BLUE SHIELD | Admitting: Internal Medicine

## 2018-10-16 ENCOUNTER — Encounter: Payer: Self-pay | Admitting: Internal Medicine

## 2018-10-16 VITALS — BP 108/64 | HR 82 | Temp 98.4°F | Wt 124.0 lb

## 2018-10-16 DIAGNOSIS — J00 Acute nasopharyngitis [common cold]: Secondary | ICD-10-CM | POA: Diagnosis not present

## 2018-10-16 MED ORDER — AZITHROMYCIN 250 MG PO TABS: ORAL_TABLET | ORAL | 0 refills | Status: AC

## 2018-10-16 MED ORDER — ALBUTEROL SULFATE 108 (90 BASE) MCG/ACT IN AEPB: 1.0000 | INHALATION_SPRAY | Freq: Four times a day (QID) | RESPIRATORY_TRACT | 0 refills | Status: AC | PRN

## 2018-10-16 NOTE — Patient Instructions (Signed)

## 2018-10-16 NOTE — Progress Notes (Signed)
HPI  Pt presents to the clinic today with c/o headache, ear fullness, nasal congestion and cough. She reports this started 2 weeks ago. She describes the headache as pressure in the forehead. She denies ear pain or decreased hearing. She is blowing yellow/green mucous out of her nose. The cough is productive of yellow/green mucous. She denies fever, chills or body aches. She has tried Allegra D, Tylenol, Robitussin, Afrin and Vicks Vapor Rub with minimal relief. She has a history of allergies. She has not had sick contacts.  Review of Systems      Past Medical History:  Diagnosis Date  . Allergy   . Arthritis   . Depression   . Frequent headaches   . Hypothyroidism   . Uterine cancer ([place]) 2014    Family History  Problem Relation Age of Onset  . Colon cancer Maternal Grandfather   . Prostate cancer Maternal Grandfather   . Hypertension Maternal Grandfather   . Diabetes Maternal Grandfather     Social History   Socioeconomic History  . Marital status: Married    Spouse name: Not on file  . Number of children: Not on file  . Years of education: Not on file  . Highest education level: Not on file  Occupational History  . Not on file  Social Needs  . Financial resource strain: Not on file  . Food insecurity:    Worry: Not on file    Inability: Not on file  . Transportation needs:    Medical: Not on file    Non-medical: Not on file  Tobacco Use  . Smoking status: Current Every Day Smoker    Packs/day: 0.50    Years: 30.00    Pack years: 15.00    Types: Cigarettes  . Smokeless tobacco: Never Used  Substance and Sexual Activity  . Alcohol use: No  . Drug use: Not on file  . Sexual activity: Not on file  Lifestyle  . Physical activity:    Days per week: Not on file    Minutes per session: Not on file  . Stress: Not on file  Relationships  . Social connections:    Talks on phone: Not on file    Gets together: Not on file    Attends religious service: Not on file     Active member of club or organization: Not on file    Attends meetings of clubs or organizations: Not on file    Relationship status: Not on file  . Intimate partner violence:    Fear of current or ex partner: Not on file    Emotionally abused: Not on file    Physically abused: Not on file    Forced sexual activity: Not on file  Other Topics Concern  . Not on file  Social History Narrative   Married.   6 children. 2 grandchildren.   Works for [employer].    Enjoys reading.     No Known Allergies   Constitutional: Positive headache. Denies fatigue, fever or abrupt weight changes.  HEENT:  Positive ear fullness, nasal congestion.  Denies eye redness, eye pain, pressure behind the eyes, facial pain, nasal congestion, ear pain, ringing in the ears, wax buildup or sore throat. Respiratory: Positive cough. Denies difficulty breathing or shortness of breath.  Cardiovascular: Denies chest pain, chest tightness, palpitations or swelling in the hands or feet.   No other specific complaints in a complete review of systems (except as listed in HPI above).  Objective:  BP 108/64   Pulse 82   Temp 98.4 F (36.9 C) (Oral)   Wt 124 lb (56.2 kg)   SpO2 98%   BMI 20.63 kg/m  Wt Readings from Last 3 Encounters:  10/16/18 124 lb (56.2 kg)  06/16/18 126 lb 8 oz (57.4 kg)  01/14/18 136 lb 12.8 oz (62.1 kg)     General: Appears her stated age, well developed, well nourished in NAD. HEENT: Head: normal shape and size, no sinus tenderness noted; Ears: bilateral cerumen impaction; Nose: mucosa pink and moist, septum midline; Throat/Mouth: + PND. Teeth present, mucosa erythematous and moist, no exudate noted, no lesions or ulcerations noted.  Neck: No cervical lymphadenopathy.  Cardiovascular: Normal rate and rhythm.  Pulmonary/Chest: Normal effort and positive vesicular breath sounds. No respiratory distress. No wheezes, rales or ronchi noted.       Assessment & Plan:   Upper  Respiratory Infection:  Get some rest and drink plenty of water eRx for Azithromax x 5 days eRx for Albuetrol inhaler Work noted provided  RTC as needed or if symptoms persist.   Webb Silversmith, NP

## 2018-11-03 DIAGNOSIS — Z23 Encounter for immunization: Secondary | ICD-10-CM | POA: Diagnosis not present

## 2018-12-07 ENCOUNTER — Other Ambulatory Visit: Payer: Self-pay | Admitting: Primary Care

## 2018-12-07 DIAGNOSIS — E039 Hypothyroidism, unspecified: Secondary | ICD-10-CM

## 2019-02-04 ENCOUNTER — Encounter: Payer: Self-pay | Admitting: Emergency Medicine

## 2019-02-04 ENCOUNTER — Telehealth: Payer: Self-pay

## 2019-02-04 ENCOUNTER — Emergency Department: Payer: BLUE CROSS/BLUE SHIELD

## 2019-02-04 ENCOUNTER — Emergency Department
Admission: EM | Admit: 2019-02-04 | Discharge: 2019-02-04 | Disposition: A | Discharge status: Home / Self Care | Payer: BLUE CROSS/BLUE SHIELD | Attending: Emergency Medicine | Admitting: Emergency Medicine

## 2019-02-04 DIAGNOSIS — S299XXA Unspecified injury of thorax, initial encounter: Secondary | ICD-10-CM | POA: Diagnosis not present

## 2019-02-04 DIAGNOSIS — S20211A Contusion of right front wall of thorax, initial encounter: Secondary | ICD-10-CM

## 2019-02-04 DIAGNOSIS — F17210 Nicotine dependence, cigarettes, uncomplicated: Secondary | ICD-10-CM | POA: Diagnosis not present

## 2019-02-04 DIAGNOSIS — Y998 Other external cause status: Secondary | ICD-10-CM | POA: Diagnosis not present

## 2019-02-04 DIAGNOSIS — W010XXA Fall on same level from slipping, tripping and stumbling without subsequent striking against object, initial encounter: Secondary | ICD-10-CM | POA: Diagnosis not present

## 2019-02-04 DIAGNOSIS — Y92009 Unspecified place in unspecified non-institutional (private) residence as the place of occurrence of the external cause: Secondary | ICD-10-CM | POA: Diagnosis not present

## 2019-02-04 DIAGNOSIS — R0781 Pleurodynia: Secondary | ICD-10-CM | POA: Diagnosis not present

## 2019-02-04 DIAGNOSIS — Y9389 Activity, other specified: Secondary | ICD-10-CM | POA: Diagnosis not present

## 2019-02-04 MED ORDER — OXYCODONE HCL 5 MG PO TABS
5.0000 mg | ORAL_TABLET | Freq: Once | ORAL | Status: AC
  Administered 2019-02-04: 5 mg via ORAL
  Filled 2019-02-04: qty 1

## 2019-02-04 MED ORDER — NAPROXEN 500 MG PO TABS: 500.0000 mg | ORAL_TABLET | Freq: Two times a day (BID) | ORAL | 0 refills | Status: AC

## 2019-02-04 MED ORDER — NAPROXEN 500 MG PO TABS
500.0000 mg | ORAL_TABLET | Freq: Once | ORAL | Status: AC
  Administered 2019-02-04: 500 mg via ORAL
  Filled 2019-02-04: qty 1

## 2019-02-04 MED ORDER — OXYCODONE-ACETAMINOPHEN 5-325 MG PO TABS: 1.0000 | ORAL_TABLET | ORAL | 0 refills | Status: AC | PRN

## 2019-02-04 NOTE — ED Provider Notes (Signed)
[facility] Emergency Department Provider Note ____________________________________________  Time seen: Approximately 12:11 PM  I have reviewed the triage vital signs and the nursing notes.  HISTORY  Chief Complaint Fall and Back Pain   HPI Lidie Glade is a 48 y.o. female sent to the emergency department for treatment and evaluation of right-sided back pain.  She was pulling a shovel off the wall last night and fell.  Pain increases with deep breath.  No loss of consciousness or other symptoms of concern.  Past Medical History:  Diagnosis Date  . Allergy   . Arthritis   . Depression   . Frequent headaches   . Hypothyroidism   . Uterine cancer [facility]) 2014    Patient Active Problem List   Diagnosis Date Noted  . Chest wall pain 06/17/2018  . Chronic bilateral low back pain without sciatica 08/13/2017  . Hypothyroidism 09/06/2016  . Anxiety and depression 09/06/2016  . Frequent headaches 09/06/2016    Past Surgical History:  Procedure Laterality Date  . ABDOMINAL HYSTERECTOMY  2016  . TUBAL LIGATION      Prior to Admission medications   Medication Sig Start Date End Date Taking? Authorizing Provider  acetaminophen (TYLENOL) 500 MG tablet Take 500 mg by mouth every 6 (six) hours as needed.    [provider]  Albuterol Sulfate (PROAIR RESPICLICK) 740 (90 Base) MCG/ACT AEPB Inhale 1 puff into the lungs every 6 (six) hours as needed. 10/16/18   Jearld Fenton, NP  azithromycin (ZITHROMAX) 250 MG tablet Take 2 tabs today, then 1 tab daily x 4 days 10/16/18   Jearld Fenton, NP  cyclobenzaprine (FLEXERIL) 10 MG tablet Take 0.5-1 tablets (5-10 mg total) by mouth 3 (three) times daily as needed for muscle spasms. 06/16/18   Tonia Ghent, MD  ibuprofen (ADVIL,MOTRIN) 600 MG tablet Take 600 mg by mouth every 6 (six) hours as needed.    [provider]  levothyroxine (SYNTHROID, LEVOTHROID) 125 MCG tablet TAKE 1 TABLET BY MOUTH EVERY  MORNING ON AN EMPTY STOMACH AND WITH A FULL GLASS OF WATER 12/08/18   Pleas Koch, NP  naproxen (NAPROSYN) 500 MG tablet Take 1 tablet (500 mg total) by mouth 2 (two) times daily with a meal. 02/04/19   Lorrena Goranson B, FNP  oxyCODONE-acetaminophen (PERCOCET) 5-325 MG tablet Take 1 tablet by mouth every 4 (four) hours as needed for severe pain. 02/04/19 02/04/20  Tahjai Schetter, Dessa Phi, FNP  traMADol (ULTRAM) 50 MG tablet Take 1 tablet (50 mg total) by mouth every 8 (eight) hours as needed for moderate pain. 06/16/18   Tonia Ghent, MD    Allergies Patient has no known allergies.  Family History  Problem Relation Age of Onset  . Colon cancer Maternal Grandfather   . Prostate cancer Maternal Grandfather   . Hypertension Maternal Grandfather   . Diabetes Maternal Grandfather     Social History Social History   Tobacco Use  . Smoking status: Current Every Day Smoker    Packs/day: 0.50    Years: 30.00    Pack years: 15.00    Types: Cigarettes  . Smokeless tobacco: Never Used  Substance Use Topics  . Alcohol use: No  . Drug use: Not on file    Review of Systems Constitutional: Well appearing. Respiratory: Negative for dyspnea. Cardiovascular: Negative for change in skin temperature or color. Musculoskeletal:   Negative for chronic steroid use   Negative for trauma in the presence of osteoporosis  Negative  for age over 57 and trauma.  Negative for constitutional symptoms, positive for history of uterine cancer   Negative for pain worse at night. Skin: Negative for rash, lesion, or wound.  Genitourinary: Negative for urinary retention. Rectal: Negative for fecal incontinence or new onset constipation/bowel habit changes. Hematological/Immunilogical: Negative for immunosuppression, IV drug use, or fever Neurological: Negative for burning, tingling, numb, electric, radiating pain in the lower extremities.                        Negative for saddle anesthesia.                         Negative for focal neurologic deficit, progressive or disabling symptoms             Negative for saddle anesthesia. ____________________________________________   PHYSICAL EXAM:  VITAL SIGNS: ED Triage Vitals  Enc Vitals Group     BP 02/04/19 0821 136/81     Pulse Rate 02/04/19 0821 75     Resp 02/04/19 0821 20     Temp 02/04/19 0821 98 F (36.7 C)     Temp Source 02/04/19 0821 Oral     SpO2 02/04/19 0821 99 %     Weight 02/04/19 0817 120 lb (54.4 kg)     Height 02/04/19 0817 5\' 5"  (1.651 m)     Head Circumference --      Peak Flow --      Pain Score 02/04/19 0817 8     Pain Loc --      Pain Edu? --      Excl. in [place]? --     Constitutional: Alert and oriented. Well appearing and in no acute distress. Eyes: Conjunctivae are clear without discharge or drainage.  Head: Atraumatic. Neck: Full, active range of motion. Respiratory: Respirations even and unlabored. Musculoskeletal: Full ROM of the back and extremities, Strength 5/5 of the lower extremities as tested. Pain in the right posterior ribs. Neurologic: Reflexes of the lower extremities are 2+. Negative straight leg raise on the right and left side. Skin: Abrasions over area of pain on right posterior rib area. Psychiatric: Behavior and affect are normal.  ____________________________________________   LABS (all labs ordered are listed, but only abnormal results are displayed)  Labs Reviewed - No data to display ____________________________________________  RADIOLOGY  Image of the right rib and chest negative for acute findings. ____________________________________________   PROCEDURES  Procedure(s) performed:  Procedures ____________________________________________   INITIAL IMPRESSION / ASSESSMENT AND PLAN / ED COURSE  Devonda Pequignot is a 48 y.o. female who presents to the emergency department after mechanical, non-syncopal fall last night.  She states she fell and landed on her back.  She struck the  posterior ribs on the right side on the ground.  Since that time she has had pain.  No shortness of breath, but the pain increases with attempt to take a deep breath.  X-ray of the chest and right rib is negative for acute bony abnormality.  Patient will be given Naprosyn and oxycodone here.  She will also be given an incentive spirometer and will be advised to use it every couple hours while awake while she has pain.  Patient advised to follow-up with her primary care provider if her symptoms are not improving over the next couple of weeks.  She was encouraged to return to the emergency department for any concern of pneumonia or other concerns if she is  unable to schedule an appointment with primary care.  Medications  oxyCODONE (Oxy IR/ROXICODONE) immediate release tablet 5 mg (has no administration in time range)  naproxen (NAPROSYN) tablet 500 mg (has no administration in time range)    ED Discharge Orders         Ordered    naproxen (NAPROSYN) 500 MG tablet  2 times daily with meals     02/04/19 1228    oxyCODONE-acetaminophen (PERCOCET) 5-325 MG tablet  Every 4 hours PRN     02/04/19 1228           Pertinent labs & imaging results that were available during my care of the patient were reviewed by me and considered in my medical decision making (see chart for details).  _________________________________________   FINAL CLINICAL IMPRESSION(S) / ED DIAGNOSES  Final diagnoses:  Rib contusion, right, initial encounter     If controlled substance prescribed during this visit, 12 month history viewed on the [place] prior to issuing an initial prescription for Schedule II or III opiod.    Victorino Dike, FNP 02/04/19 1229    Carrie Mew, MD 02/12/19 1251

## 2019-02-04 NOTE — Telephone Encounter (Signed)
Pt said she fell over a cart at [facility] on 02/03/19; pt fell on her back onto the floor.pt did not lose consciousness.pt had some back pain last night but now upper back pain at shoulder blades is much worse. Pain level now is 8. Pt also having difficulty breathing and cannot stand up straight. Pt is able to walk. Pt is not sure if has cuts on upper back where hit the cart. Pt does not have any numbness. Pt is presently at [facility] ED and has been waiting for 3 1/2 hours. Pt wants to know if can be seen at [facility] so will not continue to wait at ED. Gentry Fitz NP said pt should stay at ED for eval with above complaints. Pt voiced understanding. FYI to Gentry Fitz NP.

## 2019-02-04 NOTE — ED Notes (Signed)
Pt states she was getting shovel down from a shelf a [employer] yesterday and tripped and fell backwards landing on the shopping cart and the shovel hit her in the head. No noted facial injury. Pt has some bruising to the thorac area of the back. Pt states it hurts to breathe. IBU taken last around 730am

## 2019-02-04 NOTE — Telephone Encounter (Signed)
Noted, patient evaluated at [facility] ED.  No fracture to ribs, diagnosed with contusion and treated with Percocet and naproxen.

## 2019-02-04 NOTE — ED Triage Notes (Signed)
Pt reports she was pulling a shovel off the wall last pm and fell hurting her right side back where her ribs are. Pt states it is painful and hurts when she takes a deep breath. Denies LOC.

## 2019-02-04 NOTE — Discharge Instructions (Signed)
Use the incentive spirometer every couple of hours while you are awake.  This will help prevent pneumonia.  If you begin feeling ill or develop a cough you need to be seen by her primary care provider or return to the emergency department.

## 2019-02-04 NOTE — ED Notes (Addendum)
First Nurse Note: Patient states she fell at [place] last PM over a cart, here this AM complaining of pain in her back states it feels like "a spasm down my back".  Alert and crying while registering.  Placed in [place].

## 2019-03-06 ENCOUNTER — Other Ambulatory Visit: Payer: Self-pay | Admitting: Primary Care

## 2019-03-06 DIAGNOSIS — E039 Hypothyroidism, unspecified: Secondary | ICD-10-CM

## 2019-06-02 ENCOUNTER — Other Ambulatory Visit: Payer: Self-pay | Admitting: Primary Care

## 2019-06-02 DIAGNOSIS — E039 Hypothyroidism, unspecified: Secondary | ICD-10-CM

## 2019-11-03 ENCOUNTER — Other Ambulatory Visit: Payer: Self-pay

## 2019-11-03 ENCOUNTER — Encounter: Payer: Self-pay | Admitting: *Deleted

## 2019-11-03 ENCOUNTER — Ambulatory Visit
Admission: EM | Admit: 2019-11-03 | Discharge: 2019-11-03 | Disposition: A | Discharge status: Home / Self Care | Payer: BLUE CROSS/BLUE SHIELD | Attending: Emergency Medicine | Admitting: Emergency Medicine

## 2019-11-03 DIAGNOSIS — M546 Pain in thoracic spine: Secondary | ICD-10-CM

## 2019-11-03 DIAGNOSIS — Z760 Encounter for issue of repeat prescription: Secondary | ICD-10-CM

## 2019-11-03 DIAGNOSIS — E039 Hypothyroidism, unspecified: Secondary | ICD-10-CM

## 2019-11-03 MED ORDER — LEVOTHYROXINE SODIUM 125 MCG PO TABS: ORAL_TABLET | ORAL | 0 refills | Status: AC

## 2019-11-03 MED ORDER — IBUPROFEN 800 MG PO TABS: 800.0000 mg | ORAL_TABLET | Freq: Three times a day (TID) | ORAL | 0 refills | Status: AC | PRN

## 2019-11-03 MED ORDER — CYCLOBENZAPRINE HCL 10 MG PO TABS: 10.0000 mg | ORAL_TABLET | Freq: Two times a day (BID) | ORAL | 0 refills | Status: AC | PRN

## 2019-11-03 NOTE — ED Triage Notes (Signed)
Patient reports 3 day history of left mid back pain. Denies any injury. Patient reports pain with movement that causes her to lose breathe. Patient has been taking ibuprofen and tylenol with no relief.   Patient also states that she would like a refill of her thyroid medication, patient states she had 3 month supply and has been rationing them out.

## 2019-11-03 NOTE — Discharge Instructions (Addendum)
Take the prescribed ibuprofen as needed for your pain.  Take the muscle relaxer Flexeril as needed for muscle spasm; do not drive, operate machinery, or drink alcohol with this medication as it may make you drowsy.    Follow up with the orthopedist if your pain is not improving.  Go to the emergency department if you have worsening pain or develop new symptoms such as difficulty with urination, weakness, numbness, loss of control of your bladder or bowels, fever, or chills.    Follow-up with your primary care provider for additional refills on your thyroid medication.

## 2019-11-03 NOTE — ED Provider Notes (Signed)
Roderic Palau    CSN: [id number] Arrival date & time: 11/03/19  1242      History   Chief Complaint Chief Complaint  Patient presents with  . Back Pain    HPI Theresa Valencia is a 48 y.o. female.   Patient presents with left mid back pain x3 days.  No falls or injury but patient believes she pulled a muscle when she was reaching up.  She denies numbness, tingling, weakness.  She has attempted treatment with ibuprofen and Tylenol without relief.  Patient also request a refill on her thyroid medication.  She states she is unable to see her PCP because she does not have health insurance.  The history is provided by the patient.    Past Medical History:  Diagnosis Date  . Allergy   . Arthritis   . Depression   . Frequent headaches   . Hypothyroidism   . Uterine cancer [facility]) 2014    Patient Active Problem List   Diagnosis Date Noted  . Chest wall pain 06/17/2018  . Chronic bilateral low back pain without sciatica 08/13/2017  . Hypothyroidism 09/06/2016  . Anxiety and depression 09/06/2016  . Frequent headaches 09/06/2016    Past Surgical History:  Procedure Laterality Date  . ABDOMINAL HYSTERECTOMY  2016  . TUBAL LIGATION      OB History   No obstetric history on file.      Home Medications    Prior to Admission medications   Medication Sig Start Date End Date Taking? Authorizing Provider  acetaminophen (TYLENOL) 500 MG tablet Take 500 mg by mouth every 6 (six) hours as needed.    [provider]  Albuterol Sulfate (PROAIR RESPICLICK) [id number] (90 Base) MCG/ACT AEPB Inhale 1 puff into the lungs every 6 (six) hours as needed. 10/16/18   Jearld Fenton, NP  azithromycin (ZITHROMAX) 250 MG tablet Take 2 tabs today, then 1 tab daily x 4 days 10/16/18   Jearld Fenton, NP  cyclobenzaprine (FLEXERIL) 10 MG tablet Take 1 tablet (10 mg total) by mouth 2 (two) times daily as needed for muscle spasms. 11/03/19   Sharion Balloon, NP  ibuprofen (ADVIL) 800 MG  tablet Take 1 tablet (800 mg total) by mouth every 8 (eight) hours as needed. 11/03/19   Sharion Balloon, NP  levothyroxine (SYNTHROID) 125 MCG tablet TAKE 1 TABLET BY MOUTH EVERY MORNING ON AN EMPTY STOMACH AND WITH A FULL GLASS OF WATER 11/03/19   Sharion Balloon, NP  naproxen (NAPROSYN) 500 MG tablet Take 1 tablet (500 mg total) by mouth 2 (two) times daily with a meal. 02/04/19   Triplett, Cari B, FNP  oxyCODONE-acetaminophen (PERCOCET) 5-325 MG tablet Take 1 tablet by mouth every 4 (four) hours as needed for severe pain. 02/04/19 02/04/20  Triplett, Dessa Phi, FNP  traMADol (ULTRAM) 50 MG tablet Take 1 tablet (50 mg total) by mouth every 8 (eight) hours as needed for moderate pain. 06/16/18   Tonia Ghent, MD    Family History Family History  Problem Relation Age of Onset  . Colon cancer Maternal Grandfather   . Prostate cancer Maternal Grandfather   . Hypertension Maternal Grandfather   . Diabetes Maternal Grandfather     Social History Social History   Tobacco Use  . Smoking status: Current Every Day Smoker    Packs/day: 0.50    Years: 30.00    Pack years: 15.00    Types: Cigarettes  . Smokeless tobacco: Never Used  Substance Use Topics  . Alcohol use: No  . Drug use: Not on file     Allergies   Patient has no known allergies.   Review of Systems Review of Systems  Constitutional: Negative for appetite change, chills, fever and unexpected weight change.  HENT: Negative for ear pain and sore throat.   Eyes: Negative for pain and visual disturbance.  Respiratory: Negative for cough and shortness of breath.   Cardiovascular: Negative for chest pain and palpitations.  Gastrointestinal: Negative for abdominal pain and vomiting.  Endocrine: Negative for cold intolerance and heat intolerance.  Genitourinary: Negative for dysuria and hematuria.  Musculoskeletal: Positive for back pain. Negative for arthralgias.  Skin: Negative for color change and rash.  Neurological: Negative  for seizures, syncope, weakness and numbness.  All other systems reviewed and are negative.    Physical Exam Triage Vital Signs ED Triage Vitals  Enc Vitals Group     BP 11/03/19 1246 (!) 144/87     Pulse Rate 11/03/19 1246 84     Resp 11/03/19 1246 19     Temp 11/03/19 1246 98.8 F (37.1 C)     Temp Source 11/03/19 1246 Oral     SpO2 11/03/19 1246 94 %     Weight --      Height --      Head Circumference --      Peak Flow --      Pain Score 11/03/19 1244 8     Pain Loc --      Pain Edu? --      Excl. in [place]? --    No data found.  Updated Vital Signs BP (!) 144/87 (BP Location: Right Arm)   Pulse 84   Temp 98.8 F (37.1 C) (Oral)   Resp 19   SpO2 94%   Visual Acuity Right Eye Distance:   Left Eye Distance:   Bilateral Distance:    Right Eye Near:   Left Eye Near:    Bilateral Near:     Physical Exam Vitals signs and nursing note reviewed.  Constitutional:      General: She is not in acute distress.    Appearance: She is well-developed.  HENT:     Head: Normocephalic and atraumatic.     Mouth/Throat:     Mouth: Mucous membranes are moist.     Pharynx: Oropharynx is clear.  Eyes:     Conjunctiva/sclera: Conjunctivae normal.  Neck:     Musculoskeletal: Neck supple.  Cardiovascular:     Rate and Rhythm: Normal rate and regular rhythm.     Heart sounds: No murmur.  Pulmonary:     Effort: Pulmonary effort is normal. No respiratory distress.     Breath sounds: Normal breath sounds.  Abdominal:     General: Bowel sounds are normal.     Palpations: Abdomen is soft.     Tenderness: There is no abdominal tenderness. There is no right CVA tenderness, left CVA tenderness, guarding or rebound.  Musculoskeletal: Normal range of motion.        General: No swelling, tenderness, deformity or signs of injury.  Skin:    General: Skin is warm and dry.     Capillary Refill: Capillary refill takes less than 2 seconds.  Neurological:     General: No focal deficit  present.     Mental Status: She is alert and oriented to person, place, and time.     Sensory: No sensory deficit.     Motor:  No weakness.     Gait: Gait normal.  Psychiatric:        Mood and Affect: Mood normal.        Behavior: Behavior normal.      UC Treatments / Results  Labs (all labs ordered are listed, but only abnormal results are displayed) Labs Reviewed - No data to display  EKG   Radiology No results found.  Procedures Procedures (including critical care time)  Medications Ordered in UC Medications - No data to display  Initial Impression / Assessment and Plan / UC Course  I have reviewed the triage vital signs and the nursing notes.  Pertinent labs & imaging results that were available during my care of the patient were reviewed by me and considered in my medical decision making (see chart for details).    Acute back pain.  Patient request for refill of her levothyroxine.  Treating back pain with ibuprofen and Flexeril.  Precautions for drowsiness with Flexeril discussed with patient.  Instructed her to follow-up with orthopedics if her pain is not improving.  Discussed with patient that one 30-day supply of her thyroid medication will be given today but that she needs to follow-up with her PCP for additional refills as this medication requires blood work.  Patient agrees to plan of care.   Final Clinical Impressions(s) / UC Diagnoses   Final diagnoses:  Acute left-sided thoracic back pain  Medication refill     Discharge Instructions     Take the prescribed ibuprofen as needed for your pain.  Take the muscle relaxer Flexeril as needed for muscle spasm; do not drive, operate machinery, or drink alcohol with this medication as it may make you drowsy.    Follow up with the orthopedist if your pain is not improving.  Go to the emergency department if you have worsening pain or develop new symptoms such as difficulty with urination, weakness, numbness, loss  of control of your bladder or bowels, fever, or chills.    Follow-up with your primary care provider for additional refills on your thyroid medication.         ED Prescriptions    Medication Sig Dispense Auth. Provider   ibuprofen (ADVIL) 800 MG tablet Take 1 tablet (800 mg total) by mouth every 8 (eight) hours as needed. 21 tablet Sharion Balloon, NP   cyclobenzaprine (FLEXERIL) 10 MG tablet Take 1 tablet (10 mg total) by mouth 2 (two) times daily as needed for muscle spasms. 20 tablet Sharion Balloon, NP   levothyroxine (SYNTHROID) 125 MCG tablet TAKE 1 TABLET BY MOUTH EVERY MORNING ON AN EMPTY STOMACH AND WITH A FULL GLASS OF WATER 30 tablet Sharion Balloon, NP     PDMP not reviewed this encounter.   Sharion Balloon, NP 11/03/19 1328

## 2020-08-09 ENCOUNTER — Emergency Department
Admission: EM | Admit: 2020-08-09 | Discharge: 2020-08-09 | Disposition: A | Discharge status: Home / Self Care | Payer: Medicaid Other | Attending: Emergency Medicine | Admitting: Emergency Medicine

## 2020-08-09 ENCOUNTER — Ambulatory Visit: Payer: Self-pay

## 2020-08-09 ENCOUNTER — Emergency Department: Payer: Medicaid Other

## 2020-08-09 ENCOUNTER — Other Ambulatory Visit: Payer: Self-pay

## 2020-08-09 DIAGNOSIS — Z79899 Other long term (current) drug therapy: Secondary | ICD-10-CM | POA: Insufficient documentation

## 2020-08-09 DIAGNOSIS — L03019 Cellulitis of unspecified finger: Secondary | ICD-10-CM

## 2020-08-09 DIAGNOSIS — E039 Hypothyroidism, unspecified: Secondary | ICD-10-CM | POA: Insufficient documentation

## 2020-08-09 DIAGNOSIS — F17210 Nicotine dependence, cigarettes, uncomplicated: Secondary | ICD-10-CM | POA: Insufficient documentation

## 2020-08-09 DIAGNOSIS — Z23 Encounter for immunization: Secondary | ICD-10-CM | POA: Insufficient documentation

## 2020-08-09 DIAGNOSIS — Z79890 Hormone replacement therapy (postmenopausal): Secondary | ICD-10-CM | POA: Insufficient documentation

## 2020-08-09 DIAGNOSIS — R945 Abnormal results of liver function studies: Secondary | ICD-10-CM | POA: Insufficient documentation

## 2020-08-09 DIAGNOSIS — L03011 Cellulitis of right finger: Secondary | ICD-10-CM | POA: Insufficient documentation

## 2020-08-09 DIAGNOSIS — R748 Abnormal levels of other serum enzymes: Secondary | ICD-10-CM

## 2020-08-09 LAB — COMPREHENSIVE METABOLIC PANEL
ALT: 51 U/L — ABNORMAL HIGH (ref 0–44)
AST: 83 U/L — ABNORMAL HIGH (ref 15–41)
Albumin: 4.7 g/dL (ref 3.5–5.0)
Alkaline Phosphatase: 87 U/L (ref 38–126)
Anion gap: 15 (ref 5–15)
BUN: 12 mg/dL (ref 6–20)
CO2: 24 mmol/L (ref 22–32)
Calcium: 8.9 mg/dL (ref 8.9–10.3)
Chloride: 99 mmol/L (ref 98–111)
Creatinine, Ser: 0.66 mg/dL (ref 0.44–1.00)
GFR calc Af Amer: >60 mL/min (ref >60)
GFR calc non Af Amer: >60 mL/min (ref >60)
Glucose, Bld: 96 mg/dL (ref 70–99)
Potassium: 3.5 mmol/L (ref 3.5–5.1)
Sodium: 138 mmol/L (ref 135–145)
Total Bilirubin: 1.9 mg/dL — ABNORMAL HIGH (ref 0.3–1.2)
Total Protein: 7.7 g/dL (ref 6.5–8.1)

## 2020-08-09 LAB — CBC WITH DIFFERENTIAL/PLATELET
Abs Immature Granulocytes: 0.02 10*3/uL (ref 0.00–0.07)
Basophils Absolute: 0 10*3/uL (ref 0.0–0.1)
Basophils Relative: 1 %
Eosinophils Absolute: 0.1 10*3/uL (ref 0.0–0.5)
Eosinophils Relative: 1 %
HCT: 36.5 % (ref 36.0–46.0)
Hemoglobin: 13.5 g/dL (ref 12.0–15.0)
Immature Granulocytes: 0 %
Lymphocytes Relative: 12 %
Lymphs Abs: 0.9 10*3/uL (ref 0.7–4.0)
MCH: 39.7 pg — ABNORMAL HIGH (ref 26.0–34.0)
MCHC: 37 g/dL — ABNORMAL HIGH (ref 30.0–36.0)
MCV: 107.4 fL — ABNORMAL HIGH (ref 80.0–100.0)
Monocytes Absolute: 0.5 10*3/uL (ref 0.1–1.0)
Monocytes Relative: 6 %
Neutro Abs: 5.8 10*3/uL (ref 1.7–7.7)
Neutrophils Relative %: 80 %
Platelets: 160 10*3/uL (ref 150–400)
RBC: 3.4 MIL/uL — ABNORMAL LOW (ref 3.87–5.11)
RDW: 12.7 % (ref 11.5–15.5)
WBC: 7.2 10*3/uL (ref 4.0–10.5)
nRBC: 0 % (ref 0.0–0.2)

## 2020-08-09 LAB — LACTIC ACID, PLASMA: Lactic Acid, Venous: 0.9 mmol/L (ref 0.5–1.9)

## 2020-08-09 MED ORDER — LIDOCAINE HCL (PF) 1 % IJ SOLN
5.0000 mL | Freq: Once | INTRAMUSCULAR | Status: AC
  Administered 2020-08-09: 5 mL via INTRADERMAL
  Filled 2020-08-09: qty 5

## 2020-08-09 MED ORDER — MORPHINE SULFATE (PF) 4 MG/ML IV SOLN
4.0000 mg | Freq: Once | INTRAVENOUS | Status: AC
  Administered 2020-08-09: 4 mg via INTRAVENOUS
  Filled 2020-08-09: qty 1

## 2020-08-09 MED ORDER — SULFAMETHOXAZOLE-TRIMETHOPRIM 800-160 MG PO TABS: 1.0000 | ORAL_TABLET | Freq: Two times a day (BID) | ORAL | 0 refills | Status: AC

## 2020-08-09 MED ORDER — AMOXICILLIN-POT CLAVULANATE 875-125 MG PO TABS: 1.0000 | ORAL_TABLET | Freq: Two times a day (BID) | ORAL | 0 refills | Status: AC

## 2020-08-09 MED ORDER — ONDANSETRON 4 MG PO TBDP
ORAL_TABLET | ORAL | Status: AC
  Administered 2020-08-09: 4 mg via ORAL
  Filled 2020-08-09: qty 1

## 2020-08-09 MED ORDER — OXYCODONE HCL 5 MG PO TABS
5.0000 mg | ORAL_TABLET | Freq: Once | ORAL | Status: AC
  Administered 2020-08-09: 5 mg via ORAL
  Filled 2020-08-09: qty 1

## 2020-08-09 MED ORDER — TETANUS-DIPHTH-ACELL PERTUSSIS 5-2.5-18.5 LF-MCG/0.5 IM SUSP
0.5000 mL | Freq: Once | INTRAMUSCULAR | Status: AC
  Administered 2020-08-09: 0.5 mL via INTRAMUSCULAR
  Filled 2020-08-09: qty 0.5

## 2020-08-09 MED ORDER — OXYCODONE HCL 5 MG PO CAPS: 5.0000 mg | ORAL_CAPSULE | Freq: Three times a day (TID) | ORAL | 0 refills | Status: AC | PRN

## 2020-08-09 MED ORDER — SODIUM CHLORIDE 0.9 % IV SOLN
1.0000 g | Freq: Once | INTRAVENOUS | Status: AC
  Administered 2020-08-09: 1 g via INTRAVENOUS
  Filled 2020-08-09: qty 10

## 2020-08-09 MED ORDER — ONDANSETRON HCL 4 MG/2ML IJ SOLN
4.0000 mg | Freq: Once | INTRAMUSCULAR | Status: AC
  Administered 2020-08-09: 4 mg via INTRAVENOUS
  Filled 2020-08-09: qty 2

## 2020-08-09 MED ORDER — ONDANSETRON 4 MG PO TBDP: 4.0000 mg | ORAL_TABLET | Freq: Once | ORAL | Status: AC

## 2020-08-09 MED ORDER — SULFAMETHOXAZOLE-TRIMETHOPRIM 800-160 MG PO TABS
1.0000 | ORAL_TABLET | Freq: Once | ORAL | Status: AC
  Administered 2020-08-09: 1 via ORAL
  Filled 2020-08-09: qty 1
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
## 2020-08-09 NOTE — Discharge Instructions (Addendum)
You have an infection to your fingertip.  Please begin antibiotics in the morning.  Please call hand orthopedics tomorrow for a follow-up appointment this week.  Your liver enzymes were elevated in the emergency department.  Please follow-up with primary care for further evaluation and recheck.

## 2020-08-09 NOTE — ED Notes (Signed)
Wound cleaned and dressed per Md orders

## 2020-08-14 LAB — CULTURE, BLOOD (ROUTINE X 2)
Culture: NO GROWTH
Culture: NO GROWTH
Special Requests: ADEQUATE
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
